# Patient Record
Sex: Female | Born: 1960 | Race: White | Hispanic: No | Marital: Married | Smoking: Former smoker
Health system: Southern US, Community
[De-identification: ages and names within clinical notes are randomized; demographics above are authoritative.]

## PROBLEM LIST (undated history)

## (undated) DIAGNOSIS — K219 Gastro-esophageal reflux disease without esophagitis: Secondary | ICD-10-CM

## (undated) DIAGNOSIS — E039 Hypothyroidism, unspecified: Secondary | ICD-10-CM

## (undated) DIAGNOSIS — I10 Essential (primary) hypertension: Secondary | ICD-10-CM

## (undated) DIAGNOSIS — J45909 Unspecified asthma, uncomplicated: Secondary | ICD-10-CM

## (undated) HISTORY — PX: TUBAL LIGATION: SHX77

## (undated) HISTORY — PX: BREAST CYST ASPIRATION: SHX578

---

## 2002-02-22 ENCOUNTER — Encounter: Payer: Self-pay | Admitting: Family Medicine

## 2002-02-22 ENCOUNTER — Encounter: Admission: RE | Admit: 2002-02-22 | Discharge: 2002-02-22 | Payer: Self-pay | Admitting: Family Medicine

## 2004-08-08 ENCOUNTER — Ambulatory Visit: Payer: Self-pay | Admitting: Family Medicine

## 2004-10-23 ENCOUNTER — Ambulatory Visit: Payer: Self-pay | Admitting: Family Medicine

## 2004-10-24 ENCOUNTER — Ambulatory Visit: Payer: Self-pay | Admitting: Family Medicine

## 2004-10-28 ENCOUNTER — Ambulatory Visit: Payer: Self-pay | Admitting: Family Medicine

## 2004-11-12 ENCOUNTER — Ambulatory Visit: Payer: Self-pay | Admitting: Family Medicine

## 2007-08-15 ENCOUNTER — Emergency Department (HOSPITAL_COMMUNITY): Admission: EM | Admit: 2007-08-15 | Discharge: 2007-08-15 | Payer: Self-pay | Admitting: Emergency Medicine

## 2012-02-10 ENCOUNTER — Other Ambulatory Visit: Payer: Self-pay | Admitting: Family Medicine

## 2012-02-10 DIAGNOSIS — Z1231 Encounter for screening mammogram for malignant neoplasm of breast: Secondary | ICD-10-CM

## 2012-02-17 ENCOUNTER — Ambulatory Visit
Admission: RE | Admit: 2012-02-17 | Discharge: 2012-02-17 | Disposition: A | Discharge status: Home / Self Care | Payer: 59 | Source: Ambulatory Visit | Attending: Family Medicine | Admitting: Family Medicine

## 2012-02-17 DIAGNOSIS — Z1231 Encounter for screening mammogram for malignant neoplasm of breast: Secondary | ICD-10-CM

## 2013-11-27 ENCOUNTER — Other Ambulatory Visit: Payer: Self-pay | Admitting: Obstetrics & Gynecology

## 2013-11-27 ENCOUNTER — Encounter (HOSPITAL_COMMUNITY): Payer: Self-pay | Admitting: Pharmacy Technician

## 2013-11-29 ENCOUNTER — Encounter (HOSPITAL_COMMUNITY): Payer: Self-pay

## 2013-11-29 ENCOUNTER — Encounter (HOSPITAL_COMMUNITY)
Admission: RE | Admit: 2013-11-29 | Discharge: 2013-11-29 | Disposition: A | Discharge status: Home / Self Care | Payer: BC Managed Care – PPO | Source: Ambulatory Visit | Attending: Obstetrics & Gynecology | Admitting: Obstetrics & Gynecology

## 2013-11-29 HISTORY — DX: Gastro-esophageal reflux disease without esophagitis: K21.9

## 2013-11-29 HISTORY — DX: Essential (primary) hypertension: I10

## 2013-11-29 HISTORY — DX: Unspecified asthma, uncomplicated: J45.909

## 2013-11-29 HISTORY — DX: Hypothyroidism, unspecified: E03.9

## 2013-11-29 LAB — COMPREHENSIVE METABOLIC PANEL
ALBUMIN: 3.7 g/dL (ref 3.5–5.2)
ALK PHOS: 81 U/L (ref 39–117)
ALT: 14 U/L (ref 0–35)
AST: 22 U/L (ref 0–37)
Anion gap: 10 (ref 5–15)
BILIRUBIN TOTAL: 0.4 mg/dL (ref 0.3–1.2)
BUN: 12 mg/dL (ref 6–23)
CHLORIDE: 101 meq/L (ref 96–112)
CO2: 27 mEq/L (ref 19–32)
Calcium: 10.3 mg/dL (ref 8.4–10.5)
Creatinine, Ser: 1.16 mg/dL — ABNORMAL HIGH (ref 0.50–1.10)
GFR calc Af Amer: 61 mL/min — ABNORMAL LOW (ref >90)
GFR calc non Af Amer: 53 mL/min — ABNORMAL LOW (ref >90)
Glucose, Bld: 91 mg/dL (ref 70–99)
POTASSIUM: 4.4 meq/L (ref 3.7–5.3)
Sodium: 138 mEq/L (ref 137–147)
Total Protein: 7.2 g/dL (ref 6.0–8.3)

## 2013-11-29 LAB — TYPE AND SCREEN
ABO/RH(D): O POS
Antibody Screen: NEGATIVE

## 2013-11-29 LAB — CBC
HCT: 38.1 % (ref 36.0–46.0)
Hemoglobin: 12.6 g/dL (ref 12.0–15.0)
MCH: 28.5 pg (ref 26.0–34.0)
MCHC: 33.1 g/dL (ref 30.0–36.0)
MCV: 86.2 fL (ref 78.0–100.0)
PLATELETS: 241 10*3/uL (ref 150–400)
RBC: 4.42 MIL/uL (ref 3.87–5.11)
RDW: 12.8 % (ref 11.5–15.5)
WBC: 5.1 10*3/uL (ref 4.0–10.5)

## 2013-11-29 LAB — ABO/RH: ABO/RH(D): O POS

## 2013-11-29 NOTE — Pre-Procedure Instructions (Signed)
EKG reviewed and accepted by Dr Arby BarretteHatchett.

## 2013-11-29 NOTE — Patient Instructions (Signed)
20 Lisa MarionDonna K Friedman  11/29/2013   Your procedure is scheduled on:  11/30/13  Enter through the Main Entrance of [facility] at 12:15 PM  Pick up the phone at the desk and dial [phone].   Call this number if you have problems the morning of surgery: [phone]   Remember:   Do not eat food:After Midnight.  Do not drink clear liquids: 6 Hours before arrival.  Take these medicines the morning of surgery with A SIP OF WATER: Blood pressure medication, Omeprazole, bring Inhaler.   Do not wear jewelry, make-up or nail polish.  Do not wear lotions, powders, or perfumes. You may wear deodorant.  Do not shave 48 hours prior to surgery.  Do not bring valuables to the hospital.  [facility] is not   responsible for any belongings or valuables brought to the hospital.  Contacts, dentures or bridgework may not be worn into surgery.  Leave suitcase in the car. After surgery it may be brought to your room.  For patients admitted to the hospital, checkout time is 11:00 AM the day of              discharge.   Patients discharged the day of surgery will not be allowed to drive             home.  Name and phone number of your driver: NA  Special Instructions:      Please read over the following fact sheets that you were given:   Surgical Site Infection Prevention

## 2013-11-30 ENCOUNTER — Ambulatory Visit (HOSPITAL_COMMUNITY)
Admission: RE | Admit: 2013-11-30 | Discharge: 2013-12-01 | Disposition: A | Discharge status: Home / Self Care | Payer: BC Managed Care – PPO | Source: Ambulatory Visit | Attending: Obstetrics & Gynecology | Admitting: Obstetrics & Gynecology

## 2013-11-30 ENCOUNTER — Encounter (HOSPITAL_COMMUNITY)
Admission: RE | Disposition: A | Discharge status: Home / Self Care | Payer: Self-pay | Source: Ambulatory Visit | Attending: Obstetrics & Gynecology

## 2013-11-30 ENCOUNTER — Encounter (HOSPITAL_COMMUNITY): Payer: BC Managed Care – PPO | Admitting: Anesthesiology

## 2013-11-30 ENCOUNTER — Encounter (HOSPITAL_COMMUNITY): Payer: Self-pay | Admitting: Certified Registered"

## 2013-11-30 ENCOUNTER — Ambulatory Visit (HOSPITAL_COMMUNITY): Payer: BC Managed Care – PPO | Admitting: Anesthesiology

## 2013-11-30 DIAGNOSIS — Z87891 Personal history of nicotine dependence: Secondary | ICD-10-CM | POA: Insufficient documentation

## 2013-11-30 DIAGNOSIS — N812 Incomplete uterovaginal prolapse: Secondary | ICD-10-CM | POA: Insufficient documentation

## 2013-11-30 DIAGNOSIS — Z9889 Other specified postprocedural states: Secondary | ICD-10-CM

## 2013-11-30 DIAGNOSIS — J45909 Unspecified asthma, uncomplicated: Secondary | ICD-10-CM | POA: Insufficient documentation

## 2013-11-30 DIAGNOSIS — K219 Gastro-esophageal reflux disease without esophagitis: Secondary | ICD-10-CM | POA: Insufficient documentation

## 2013-11-30 DIAGNOSIS — I10 Essential (primary) hypertension: Secondary | ICD-10-CM | POA: Insufficient documentation

## 2013-11-30 DIAGNOSIS — E039 Hypothyroidism, unspecified: Secondary | ICD-10-CM | POA: Insufficient documentation

## 2013-11-30 HISTORY — PX: ROBOTIC ASSISTED TOTAL HYSTERECTOMY: SHX6085

## 2013-11-30 LAB — PREGNANCY, URINE: PREG TEST UR: NEGATIVE

## 2013-11-30 SURGERY — ROBOTIC ASSISTED TOTAL HYSTERECTOMY
Anesthesia: General | Site: Uterus | Laterality: Bilateral

## 2013-11-30 MED ORDER — SCOPOLAMINE 1 MG/3DAYS TD PT72
MEDICATED_PATCH | TRANSDERMAL | Status: AC
  Filled 2013-11-30: qty 1

## 2013-11-30 MED ORDER — LACTATED RINGERS IV SOLN
INTRAVENOUS | Status: DC
  Administered 2013-11-30: 20:00:00 via INTRAVENOUS

## 2013-11-30 MED ORDER — HYDROMORPHONE HCL PF 1 MG/ML IJ SOLN
0.2500 mg | INTRAMUSCULAR | Status: DC | PRN
  Administered 2013-11-30 (×2): 0.5 mg via INTRAVENOUS

## 2013-11-30 MED ORDER — VASOPRESSIN 20 UNIT/ML IJ SOLN
INTRAMUSCULAR | Status: AC
  Filled 2013-11-30: qty 1

## 2013-11-30 MED ORDER — NEOSTIGMINE METHYLSULFATE 10 MG/10ML IV SOLN
INTRAVENOUS | Status: DC | PRN
  Administered 2013-11-30: 4 mg via INTRAVENOUS

## 2013-11-30 MED ORDER — GLYCOPYRROLATE 0.2 MG/ML IJ SOLN
INTRAMUSCULAR | Status: DC | PRN
  Administered 2013-11-30: .7 mg via INTRAVENOUS

## 2013-11-30 MED ORDER — LIDOCAINE HCL (CARDIAC) 20 MG/ML IV SOLN
INTRAVENOUS | Status: AC
  Filled 2013-11-30: qty 5

## 2013-11-30 MED ORDER — ALBUTEROL SULFATE (2.5 MG/3ML) 0.083% IN NEBU: 2.5000 mg | INHALATION_SOLUTION | Freq: Four times a day (QID) | RESPIRATORY_TRACT | Status: DC | PRN

## 2013-11-30 MED ORDER — BUPIVACAINE HCL (PF) 0.25 % IJ SOLN
INTRAMUSCULAR | Status: AC
  Filled 2013-11-30: qty 30

## 2013-11-30 MED ORDER — NEOSTIGMINE METHYLSULFATE 10 MG/10ML IV SOLN
INTRAVENOUS | Status: AC
  Filled 2013-11-30: qty 1

## 2013-11-30 MED ORDER — FENTANYL CITRATE 0.05 MG/ML IJ SOLN
INTRAMUSCULAR | Status: AC
  Filled 2013-11-30: qty 2

## 2013-11-30 MED ORDER — MIDAZOLAM HCL 2 MG/2ML IJ SOLN
INTRAMUSCULAR | Status: DC | PRN
  Administered 2013-11-30: 2 mg via INTRAVENOUS

## 2013-11-30 MED ORDER — PROPOFOL 10 MG/ML IV BOLUS
INTRAVENOUS | Status: DC | PRN
  Administered 2013-11-30: 200 mg via INTRAVENOUS

## 2013-11-30 MED ORDER — OXYCODONE-ACETAMINOPHEN 5-325 MG PO TABS
1.0000 | ORAL_TABLET | ORAL | Status: DC | PRN
  Administered 2013-12-01: 2 via ORAL
  Filled 2013-11-30: qty 2

## 2013-11-30 MED ORDER — LIDOCAINE HCL (CARDIAC) 20 MG/ML IV SOLN
INTRAVENOUS | Status: DC | PRN
  Administered 2013-11-30: 80 mg via INTRAVENOUS

## 2013-11-30 MED ORDER — HYDROMORPHONE HCL PF 1 MG/ML IJ SOLN: 1.0000 mg | INTRAMUSCULAR | Status: DC | PRN

## 2013-11-30 MED ORDER — FENTANYL CITRATE 0.05 MG/ML IJ SOLN
INTRAMUSCULAR | Status: AC
  Filled 2013-11-30: qty 5

## 2013-11-30 MED ORDER — SODIUM CHLORIDE 0.9 % IJ SOLN
INTRAMUSCULAR | Status: AC
  Filled 2013-11-30: qty 20

## 2013-11-30 MED ORDER — MIDAZOLAM HCL 2 MG/2ML IJ SOLN
INTRAMUSCULAR | Status: AC
  Filled 2013-11-30: qty 2

## 2013-11-30 MED ORDER — ROCURONIUM BROMIDE 100 MG/10ML IV SOLN
INTRAVENOUS | Status: DC | PRN
  Administered 2013-11-30: 5 mg via INTRAVENOUS
  Administered 2013-11-30: 10 mg via INTRAVENOUS
  Administered 2013-11-30: 50 mg via INTRAVENOUS

## 2013-11-30 MED ORDER — ONDANSETRON HCL 4 MG/2ML IJ SOLN
INTRAMUSCULAR | Status: AC
  Filled 2013-11-30: qty 2

## 2013-11-30 MED ORDER — DEXAMETHASONE SODIUM PHOSPHATE 10 MG/ML IJ SOLN
INTRAMUSCULAR | Status: AC
  Filled 2013-11-30: qty 1

## 2013-11-30 MED ORDER — PROMETHAZINE HCL 25 MG/ML IJ SOLN: 6.2500 mg | INTRAMUSCULAR | Status: DC | PRN

## 2013-11-30 MED ORDER — CEFAZOLIN SODIUM-DEXTROSE 2-3 GM-% IV SOLR: 2.0000 g | INTRAVENOUS | Status: DC

## 2013-11-30 MED ORDER — PROPOFOL 10 MG/ML IV EMUL
INTRAVENOUS | Status: AC
  Filled 2013-11-30: qty 20

## 2013-11-30 MED ORDER — HYDROMORPHONE HCL PF 1 MG/ML IJ SOLN
INTRAMUSCULAR | Status: AC
  Administered 2013-11-30: 0.5 mg via INTRAVENOUS
  Filled 2013-11-30: qty 1

## 2013-11-30 MED ORDER — HYDROMORPHONE HCL PF 1 MG/ML IJ SOLN
INTRAMUSCULAR | Status: DC | PRN
  Administered 2013-11-30: 1 mg via INTRAVENOUS

## 2013-11-30 MED ORDER — LISINOPRIL-HYDROCHLOROTHIAZIDE 20-12.5 MG PO TABS
0.5000 | ORAL_TABLET | Freq: Every day | ORAL | Status: DC
  Filled 2013-11-30: qty 1

## 2013-11-30 MED ORDER — LACTATED RINGERS IR SOLN
Status: DC | PRN
  Administered 2013-11-30: 3000 mL

## 2013-11-30 MED ORDER — FENTANYL CITRATE 0.05 MG/ML IJ SOLN
INTRAMUSCULAR | Status: DC | PRN
  Administered 2013-11-30 (×5): 50 ug via INTRAVENOUS

## 2013-11-30 MED ORDER — GLYCOPYRROLATE 0.2 MG/ML IJ SOLN
INTRAMUSCULAR | Status: AC
  Filled 2013-11-30: qty 3

## 2013-11-30 MED ORDER — LEVOTHYROXINE SODIUM 50 MCG PO TABS
50.0000 ug | ORAL_TABLET | Freq: Every day | ORAL | Status: DC
  Administered 2013-12-01: 50 ug via ORAL
  Filled 2013-11-30: qty 1

## 2013-11-30 MED ORDER — KETOROLAC TROMETHAMINE 30 MG/ML IJ SOLN: 15.0000 mg | Freq: Once | INTRAMUSCULAR | Status: DC | PRN

## 2013-11-30 MED ORDER — ROCURONIUM BROMIDE 100 MG/10ML IV SOLN
INTRAVENOUS | Status: AC
  Filled 2013-11-30: qty 1

## 2013-11-30 MED ORDER — IBUPROFEN 600 MG PO TABS
600.0000 mg | ORAL_TABLET | Freq: Four times a day (QID) | ORAL | Status: DC | PRN
  Administered 2013-12-01: 600 mg via ORAL
  Filled 2013-11-30: qty 1

## 2013-11-30 MED ORDER — KETOROLAC TROMETHAMINE 30 MG/ML IJ SOLN
INTRAMUSCULAR | Status: DC | PRN
  Administered 2013-11-30: 30 mg via INTRAVENOUS

## 2013-11-30 MED ORDER — HYDROMORPHONE HCL PF 1 MG/ML IJ SOLN
INTRAMUSCULAR | Status: AC
  Filled 2013-11-30: qty 1

## 2013-11-30 MED ORDER — MEPERIDINE HCL 25 MG/ML IJ SOLN: 6.2500 mg | INTRAMUSCULAR | Status: DC | PRN

## 2013-11-30 MED ORDER — LISINOPRIL-HYDROCHLOROTHIAZIDE 20-12.5 MG PO TABS: 0.5000 | ORAL_TABLET | Freq: Every day | ORAL | Status: DC

## 2013-11-30 MED ORDER — CEFAZOLIN SODIUM-DEXTROSE 2-3 GM-% IV SOLR
INTRAVENOUS | Status: AC
  Administered 2013-11-30: 2 g via INTRAVENOUS
  Filled 2013-11-30: qty 50

## 2013-11-30 MED ORDER — DEXAMETHASONE SODIUM PHOSPHATE 10 MG/ML IJ SOLN
INTRAMUSCULAR | Status: DC | PRN
  Administered 2013-11-30: 10 mg via INTRAVENOUS

## 2013-11-30 MED ORDER — SCOPOLAMINE 1 MG/3DAYS TD PT72
1.0000 | MEDICATED_PATCH | TRANSDERMAL | Status: DC
  Administered 2013-11-30: 1.5 mg via TRANSDERMAL

## 2013-11-30 MED ORDER — MIDAZOLAM HCL 2 MG/2ML IJ SOLN: 0.5000 mg | Freq: Once | INTRAMUSCULAR | Status: DC | PRN

## 2013-11-30 MED ORDER — ONDANSETRON HCL 4 MG/2ML IJ SOLN
INTRAMUSCULAR | Status: DC | PRN
Start: 2013-11-30 — End: 2013-11-30
  Administered 2013-11-30: 4 mg via INTRAVENOUS

## 2013-11-30 MED ORDER — KETOROLAC TROMETHAMINE 30 MG/ML IJ SOLN
INTRAMUSCULAR | Status: AC
  Filled 2013-11-30: qty 1

## 2013-11-30 MED ORDER — BUPIVACAINE HCL (PF) 0.25 % IJ SOLN
INTRAMUSCULAR | Status: DC | PRN
  Administered 2013-11-30: 15 mL

## 2013-11-30 MED ORDER — LACTATED RINGERS IV SOLN
INTRAVENOUS | Status: DC
  Administered 2013-11-30 (×2): via INTRAVENOUS

## 2013-11-30 SURGICAL SUPPLY — 59 items
ADH SKN CLS APL DERMABOND .7 (GAUZE/BANDAGES/DRESSINGS) ×2
BARRIER ADHS 3X4 INTERCEED (GAUZE/BANDAGES/DRESSINGS) ×4 IMPLANT
BRR ADH 4X3 ABS CNTRL BYND (GAUZE/BANDAGES/DRESSINGS) ×2
CATH FOLEY 3WAY  5CC 16FR (CATHETERS) ×2
CATH FOLEY 3WAY 5CC 16FR (CATHETERS) ×2 IMPLANT
CLOTH BEACON ORANGE TIMEOUT ST (SAFETY) ×4 IMPLANT
CONT PATH 16OZ SNAP LID 3702 (MISCELLANEOUS) ×4 IMPLANT
COVER MAYO STAND STRL (DRAPES) ×4 IMPLANT
COVER TABLE BACK 60X90 (DRAPES) ×8 IMPLANT
COVER TIP SHEARS 8 DVNC (MISCELLANEOUS) ×2 IMPLANT
COVER TIP SHEARS 8MM DA VINCI (MISCELLANEOUS) ×2
DECANTER SPIKE VIAL GLASS SM (MISCELLANEOUS) ×4 IMPLANT
DERMABOND ADVANCED (GAUZE/BANDAGES/DRESSINGS) ×2
DERMABOND ADVANCED .7 DNX12 (GAUZE/BANDAGES/DRESSINGS) ×3 IMPLANT
DRAPE HUG U DISPOSABLE (DRAPE) ×4 IMPLANT
DRAPE WARM FLUID 44X44 (DRAPE) ×4 IMPLANT
DRSG COVADERM PLUS 2X2 (GAUZE/BANDAGES/DRESSINGS) ×12 IMPLANT
DRSG OPSITE POSTOP 3X4 (GAUZE/BANDAGES/DRESSINGS) ×3 IMPLANT
DURAPREP 26ML APPLICATOR (WOUND CARE) ×4 IMPLANT
ELECT REM PT RETURN 9FT ADLT (ELECTROSURGICAL) ×4
ELECTRODE REM PT RTRN 9FT ADLT (ELECTROSURGICAL) ×2 IMPLANT
EVACUATOR SMOKE 8.L (FILTER) ×4 IMPLANT
GAUZE VASELINE 3X9 (GAUZE/BANDAGES/DRESSINGS) IMPLANT
GLOVE BIO SURGEON STRL SZ 6.5 (GLOVE) ×3 IMPLANT
GLOVE BIO SURGEON STRL SZ7 (GLOVE) ×4 IMPLANT
GLOVE BIO SURGEONS STRL SZ 6.5 (GLOVE) ×1
GLOVE BIOGEL PI IND STRL 7.0 (GLOVE) ×14 IMPLANT
GLOVE BIOGEL PI INDICATOR 7.0 (GLOVE) ×22
GLOVE SURG SS PI 7.0 STRL IVOR (GLOVE) ×30 IMPLANT
GOWN STRL REUS W/TWL LRG LVL3 (GOWN DISPOSABLE) ×40 IMPLANT
KIT ACCESSORY DA VINCI DISP (KITS) ×2
KIT ACCESSORY DVNC DISP (KITS) ×2 IMPLANT
LEGGING LITHOTOMY PAIR STRL (DRAPES) ×4 IMPLANT
OCCLUDER COLPOPNEUMO (BALLOONS) ×3 IMPLANT
PACK ROBOT WH (CUSTOM PROCEDURE TRAY) ×4 IMPLANT
PAD PREP 24X48 CUFFED NSTRL (MISCELLANEOUS) ×8 IMPLANT
PROTECTOR NERVE ULNAR (MISCELLANEOUS) ×8 IMPLANT
SET CYSTO W/LG BORE CLAMP LF (SET/KITS/TRAYS/PACK) ×3 IMPLANT
SET IRRIG TUBING LAPAROSCOPIC (IRRIGATION / IRRIGATOR) ×8 IMPLANT
SUT ETHIBOND 0 (SUTURE) ×3 IMPLANT
SUT ETHIBOND NAB CT1 #1 30IN (SUTURE) ×6 IMPLANT
SUT VIC AB 0 CT1 27 (SUTURE) ×8
SUT VIC AB 0 CT1 27XBRD ANBCTR (SUTURE) ×4 IMPLANT
SUT VIC AB 4-0 PS2 27 (SUTURE) ×8 IMPLANT
SUT VICRYL 0 UR6 27IN ABS (SUTURE) ×8 IMPLANT
SUT VLOC 180 0 6IN GS21 (SUTURE) ×3 IMPLANT
SUT VLOC 180 0 9IN  GS21 (SUTURE) ×8
SUT VLOC 180 0 9IN GS21 (SUTURE) ×4 IMPLANT
SUT VLOC 180 2-0 9IN GS21 (SUTURE) ×3 IMPLANT
SYRINGE 60CC LL (MISCELLANEOUS) ×4 IMPLANT
TIP UTERINE 6.7X6CM WHT DISP (MISCELLANEOUS) ×3 IMPLANT
TOWEL OR 17X24 6PK STRL BLUE (TOWEL DISPOSABLE) ×12 IMPLANT
TROCAR 12M 150ML BLUNT (TROCAR) ×3 IMPLANT
TROCAR DISP BLADELESS 8 DVNC (TROCAR) ×2 IMPLANT
TROCAR DISP BLADELESS 8MM (TROCAR) ×2
TROCAR XCEL 12X100 BLDLESS (ENDOMECHANICALS) ×4 IMPLANT
TROCAR XCEL NON-BLD 5MMX100MML (ENDOMECHANICALS) ×4 IMPLANT
WARMER LAPAROSCOPE (MISCELLANEOUS) ×4 IMPLANT
WATER STERILE IRR 1000ML POUR (IV SOLUTION) ×12 IMPLANT

## 2013-11-30 NOTE — Anesthesia Preprocedure Evaluation (Signed)
Anesthesia Evaluation  Patient identified by MRN, date of birth, ID band Patient awake    Reviewed: Allergy & Precautions, H&P , Patient's Chart, lab work & pertinent test results, reviewed documented beta blocker date and time   History of Anesthesia Complications Negative for: history of anesthetic complications  Airway Mallampati: II TM Distance: >3 FB Neck ROM: full    Dental   Pulmonary asthma , former smoker,  breath sounds clear to auscultation        Cardiovascular Exercise Tolerance: Good hypertension, Rhythm:regular Rate:Normal     Neuro/Psych    GI/Hepatic GERD-  Controlled,  Endo/Other  Hypothyroidism   Renal/GU      Musculoskeletal   Abdominal   Peds  Hematology   Anesthesia Other Findings   Reproductive/Obstetrics                           Anesthesia Physical Anesthesia Plan  ASA: II  Anesthesia Plan: General ETT   Post-op Pain Management:    Induction:   Airway Management Planned:   Additional Equipment:   Intra-op Plan:   Post-operative Plan:   Informed Consent: I have reviewed the patients History and Physical, chart, labs and discussed the procedure including the risks, benefits and alternatives for the proposed anesthesia with the patient or authorized representative who has indicated his/her understanding and acceptance.   Dental Advisory Given  Plan Discussed with: CRNA and Surgeon  Anesthesia Plan Comments:         Anesthesia Quick Evaluation

## 2013-11-30 NOTE — H&P (Signed)
Alain MarionDonna K Boliver is an 53 y.o. female G2P2L3 (1 adopted)  Married  RP:  Uterine prolapse/CystoRectocele for Robotic TLH/Salpingectomies/Utero-Sacral ligament suspension and Anterio-Posterior repair  Pertinent Gynecological History: Menses: post-menopausal Contraception: post menopausal status /BT/S Blood transfusions: none Sexually transmitted diseases: no past history Last mammogram: normal  Last pap: normal  OB History: G2P2L3 (1 adopted)  Menstrual History:  No LMP recorded.    Past Medical History  Diagnosis Date  . Hypertension   . Asthma   . GERD (gastroesophageal reflux disease)   . Hypothyroidism     Past Surgical History  Procedure Laterality Date  . Tubal ligation      No family history on file.  Social History:  reports that she has quit smoking. She does not have any smokeless tobacco history on file. She reports that she does not drink alcohol or use illicit drugs.  Allergies: No Known Allergies  Prescriptions prior to admission  Medication Sig Dispense Refill  . aspirin 81 MG tablet Take 81 mg by mouth daily.      Marland Kitchen. levothyroxine (SYNTHROID, LEVOTHROID) 50 MCG tablet Take 50 mcg by mouth daily before breakfast.      . lisinopril-hydrochlorothiazide (PRINZIDE,ZESTORETIC) 20-12.5 MG per tablet Take 0.5 tablets by mouth daily.      . Omeprazole (PRILOSEC PO) Take 1 tablet by mouth daily.      Marland Kitchen. albuterol (PROVENTIL HFA;VENTOLIN HFA) 108 (90 BASE) MCG/ACT inhaler Inhale 2 puffs into the lungs every 6 (six) hours as needed for wheezing or shortness of breath.      Marland Kitchen. ibuprofen (ADVIL,MOTRIN) 200 MG tablet Take 400 mg by mouth every 6 (six) hours as needed for headache.        ROS  Blood pressure 122/79, pulse 74, temperature 98.1 F (36.7 C), temperature source Oral, resp. rate 18, SpO2 100.00%. Physical Exam  Uterine prolapse grade 2/3, Cystocele grade 3/4, Rectocele grade 1/3.  Results for orders placed during the hospital encounter of 11/30/13 (from the  past 24 hour(s))  PREGNANCY, URINE     Status: None   Collection Time    11/30/13 12:15 PM      Result Value Ref Range   Preg Test, Ur NEGATIVE  NEGATIVE    No results found.  Assessment/Plan: Uterine Prolapse/Cysto-Rectocele for Robotic TLH/Salpingectomies/Utero-Sacral ligament suspension and AP repair.  Surgery and risks reviewed.  Kealie Barrie,MARIE-LYNE 11/30/2013, 1:39 PM

## 2013-11-30 NOTE — Transfer of Care (Signed)
Immediate Anesthesia Transfer of Care Note  Patient: Lisa MarionDonna K Redler  Procedure(s) Performed: Procedure(s): ROBOTIC ASSISTED TOTAL HYSTERECTOMY with bilateral salpingectomy and epicentric uterosacral ligament fixation for sacrocolpopexy and cystoscopy (Bilateral)  Patient Location: PACU  Anesthesia Type:General  Level of Consciousness: awake, alert  and oriented  Airway & Oxygen Therapy: Patient Spontanous Breathing and Patient connected to nasal cannula oxygen  Post-op Assessment: Report given to PACU RN, Post -op Vital signs reviewed and stable and Patient moving all extremities  Post vital signs: Reviewed and stable  Complications: No apparent anesthesia complications

## 2013-11-30 NOTE — Anesthesia Postprocedure Evaluation (Signed)
Anesthesia Post Note  Patient: Alain MarionDonna K Petros  Procedure(s) Performed: Procedure(s) (LRB): ROBOTIC ASSISTED TOTAL HYSTERECTOMY with bilateral salpingectomy and epicentric uterosacral ligament fixation for sacrocolpopexy and cystoscopy (Bilateral)  Anesthesia type: General  Patient location: PACU  Post pain: Pain level controlled  Post assessment: Post-op Vital signs reviewed  Last Vitals:  Filed Vitals:   11/30/13 1815  BP: 121/75  Pulse: 75  Temp: 36.7 C  Resp: 12    Post vital signs: Reviewed  Level of consciousness: sedated  Complications: No apparent anesthesia complications

## 2013-11-30 NOTE — Op Note (Signed)
11/30/2013  5:05 PM  PATIENT:  Lisa Friedman  53 y.o. female  PRE-OPERATIVE DIAGNOSIS:  Symptomatic Uterine Prolapse, CystoRectocele  POST-OPERATIVE DIAGNOSIS:  Symptomatic Uterine Prolapse, CystoRectocele  PROCEDURE:  Procedure(s): ROBOTIC ASSISTED TOTAL HYSTERECTOMY with bilateral salpingectomy and epicentric uterosacral ligament fixation for sacrocolpopexy and cystoscopy  SURGEON:  Surgeon(s): Genia DelMarie-Lyne Bonnee Zertuche, MD Alphonsus SiasKelly A. Ernestina PennaFogleman, MD  ASSISTANTS: Alphonsus SiasKelly A. Fogleman MD   ANESTHESIA:   general  PROCEDURE:  Under general anesthesia with endotracheal intubation the patient is in lithotomy position. She is prepped with ChloraPrep on the abdomen and with Betadine on the suprapubic, vulvar and vaginal areas. She is draped as usual. The Foley is inserted in the bladder. The vaginal exam reveals a normal-size uterus with a grade 2/3 prolapse, no adnexal mass. A cystocele grade 3/4 and a rectocele grade are present.  A #6 roomy with the medium Ko-ring are put in place easily.  Abdominally, a supraumbilical incision is made with a scalpel over 1.5 cm after infiltrating Marcaine one quarter plain. The aponeurosis is opened with Mayo scissors under direct vision. The peritoneum is opened bluntly with a finger. A pursestring stitch of Vicryl 0 is done on the aponeurosis. The Roseanne RenoHassan is inserted at that level and a pneumoperitoneum is created with CO2.  We used a semicircular configuration for port placement.  2 robotic ports on the right and one robotic ports on the lower left with the assistant port other 5 mm trochar on the upper left.  All sites were infiltrated with Marcaine one quarter plain and a small incision done with the scalpel. All ports were inserted under direct vision. The robot his docked on the right side.  The robotic instruments are inserted under direct vision with the Endo Shears scissors and the first arm, the PK in the second arm and the fenestrated clamp in the third arm.  We go to  the console. The pictures are taken of the uterus both tubes and ovaries, the appendix and the liver.  The patient is status post bilateral tubal sterilization.  Both ureters are normal anatomic position. We starts on the left side we cauterized and section the left round ligament. We cauterized and section the left distal part of the tube.  We then cauterized and section the left utero-ovarian ligament. We opened the visceral peritoneum anteriorly and start descending the bladder. We proceed exactly the same way on the right side.  Both tubes are removed through the robotic port by the assistant.  We then further descended the bladder passed the core ring.  The right uterine artery was cauterized and the left uterine artery was cauterized  and sectioned.  We then went back on the right side and section the right uterine artery.  The upper aspect of the vaginal vault was opened over the coring with the tip of the Endo Shears scissor starting posteriorly, then anteriorly and finishing on both sides to both sides.  The uterus with cervix was completely detached and passed vaginally. It was sent to pathology with both tubes.  The occluder was put in the vagina.  We switched instruments to the cutting needle driver and the first arm, the lungs tip and the second arm and the PK in the third arm. We used a V. Lock 0 in a running suture to close the vaginal vault, starting at the right angle and finishing at the left angle and then coming back on our steps.  We then used a V. Lock 0 for the epicentric  bilateral uterosacral ligament fixation for sacrocolpopexy.  We starts on the right side, taking a bite on the right uterosacral ligament distally and suturing it to the proximal aspect of the ligament close to the vaginal vault.  We then took him bites in the vaginal vault and came back to the uterosacral ligament.  We proceeded exactly the same way on the left side.  Both ureters were at the very good distance from the  uterosacral ligaments and well-visualized.  We reinforced the attachment of the uterosacral ligament to the vaginal vault on each side with a permanent stitch of Ethibonb 0.  We then covered those stitches by closing the peritoneum with a running suture of V. LOC 2-0 above the vaginal vault.  Hemostasis was adequate at all levels.  The pelvic cavity was irrigated and suctioned. The needles were parked on the left abdominal wall.  We removed all robotic instruments. The robot is undocked.  We then went by laparoscopy. All needles were removed. Hemostasis was confirmed once more. The patient was removed from Trendelenburg and a small amount of fluid was suctioned.  We then are removed laparoscopy instruments. We removed the ports under direct vision.  The CO2 was evacuated.  The pursestring stitch at the supraumbilical incision was closed. All incisions were closed with a subcuticular stitch of Vicryl 4-0. Dermabond was added on all incisions.  We then went to the the vagina with the patient in lithotomy position.  The occluder was removed.  The Foley was also removed. We proceeded with a cystoscopy which revealed good ureteral jets bilaterally.  Even with the bladder full, no cystocele was present after the apical suspension.  The rectocele was also completely corrected by the suspension of the vaginal vault.  The decision was therefore taken not to proceed with any anterior or posterior repair.  The patient was brought to recovery room in good and stable status.  ESTIMATED BLOOD LOSS:  50 cc   Intake/Output Summary (Last 24 hours) at 11/30/13 1705 Last data filed at 11/30/13 1655  Gross per 24 hour  Intake   1000 ml  Output    450 ml  Net    550 ml     BLOOD ADMINISTERED:none   LOCAL MEDICATIONS USED:  MARCAINE     SPECIMEN:  Source of Specimen:  Uterus with cervix and bilateral tubes  DISPOSITION OF SPECIMEN:  PATHOLOGY  COUNTS:  YES  PLAN OF CARE: Transfer to PACU  Genia DelMarie-Lyne Helyne Genther MD   11/30/2013 at 5:05 pm

## 2013-12-01 ENCOUNTER — Encounter (HOSPITAL_COMMUNITY): Payer: Self-pay | Admitting: Obstetrics & Gynecology

## 2013-12-01 LAB — CBC
HEMATOCRIT: 36 % (ref 36.0–46.0)
HEMOGLOBIN: 12.2 g/dL (ref 12.0–15.0)
MCH: 29 pg (ref 26.0–34.0)
MCHC: 33.9 g/dL (ref 30.0–36.0)
MCV: 85.5 fL (ref 78.0–100.0)
Platelets: 240 10*3/uL (ref 150–400)
RBC: 4.21 MIL/uL (ref 3.87–5.11)
RDW: 12.7 % (ref 11.5–15.5)
WBC: 10.7 10*3/uL — AB (ref 4.0–10.5)

## 2013-12-01 MED ORDER — OXYCODONE-ACETAMINOPHEN 7.5-325 MG PO TABS: 1.0000 | ORAL_TABLET | ORAL | Status: DC | PRN

## 2013-12-01 NOTE — Anesthesia Postprocedure Evaluation (Signed)
  Anesthesia Post-op Note  Anesthesia Post Note  Patient: Lisa Friedman  Procedure(s) Performed: Procedure(s) (LRB): ROBOTIC ASSISTED TOTAL HYSTERECTOMY with bilateral salpingectomy and epicentric uterosacral ligament fixation for sacrocolpopexy and cystoscopy (Bilateral)  Anesthesia type: General  Patient location: [facility]  Post pain: Pain level controlled  Post assessment: Post-op Vital signs reviewed  Last Vitals:  Filed Vitals:   12/01/13 0512  BP: 88/50  Pulse: 84  Temp: 36.8 C  Resp: 16    Post vital signs: Reviewed  Level of consciousness: sedated  Complications: No apparent anesthesia complications

## 2013-12-01 NOTE — Progress Notes (Signed)
Pt discharged to home with husband.  Condition stable.  Pt to car via wheelchair with RN.  No equipment for home ordered at discharge. 

## 2013-12-01 NOTE — Progress Notes (Signed)
1 Day Post-Op Procedure(s) (LRB): ROBOTIC ASSISTED TOTAL HYSTERECTOMY with bilateral salpingectomy and epicentric uterosacral ligament fixation for sacrocolpopexy and cystoscopy (Bilateral)  Subjective: Patient reports that pain is well managed.  Tolerating normal diet as tolerated  diet without difficulty. No nausea / vomiting.  Ambulating and voiding.  Objective: BP 88/50  Pulse 84  Temp(Src) 98.2 F (36.8 C) (Oral)  Resp 16  Ht 5\' 3"  (1.6 m)  Wt 80.74 kg (178 lb)  BMI 31.54 kg/m2  SpO2 95% Lungs: clear Heart: normal rate and rhythm Abdomen:soft and appropriately tender Extremities: Homans sign is negative, no sign of DVT Incision: healing well  Postop Hb 12.2, WBC 10.7  Assessment: s/p Procedure(s): ROBOTIC ASSISTED TOTAL HYSTERECTOMY with bilateral salpingectomy and epicentric uterosacral ligament fixation for sacrocolpopexy and cystoscopy: stable and progressing well  Plan: Encourage ambulation Discharge home  LOS: 1 day    Katy Brickell,MARIE-LYNE 12/01/2013, 7:05 AM

## 2013-12-01 NOTE — Addendum Note (Signed)
Addendum created 12/01/13 [id number] by Turner DanielsJennifer L Sylvana Bonk, CRNA   Modules edited: Notes Section   Notes Section:  File: [id number]

## 2013-12-01 NOTE — Discharge Instructions (Signed)
Total Laparoscopic Hysterectomy, Care After °Refer to this sheet in the next few weeks. These instructions provide you with information on caring for yourself after your procedure. Your health care provider may also give you more specific instructions. Your treatment has been planned according to current medical practices, but problems sometimes occur. Call your health care provider if you have any problems or questions after your procedure. °WHAT TO EXPECT AFTER THE PROCEDURE °· Pain and bruising at the incision sites. You will be given pain medicine to control it. °· Menopausal symptoms such as hot flashes, night sweats, and insomnia if your ovaries were removed. °· Sore throat from the breathing tube that was inserted during surgery. °HOME CARE INSTRUCTIONS °· Only take over-the-counter or prescription medicines for pain, discomfort, or fever as directed by your health care provider.   °· Do not take aspirin. It can cause bleeding.   °· Do not drive when taking pain medicine.   °· Follow your health care provider's advice regarding diet, exercise, lifting, driving, and general activities.   °· Resume your usual diet as directed and allowed.   °· Get plenty of rest and sleep.   °· Do not douche, use tampons, or have sexual intercourse for at least 6 weeks, or until your health care provider gives you permission.   °· Change your bandages (dressings) as directed by your health care provider.   °· Monitor your temperature and notify your health care provider of a fever.   °· Take showers instead of baths for 2-3 weeks.   °· Do not drink alcohol until your health care provider gives you permission.   °· If you develop constipation, you may take a mild laxative with your health care provider's permission. Bran foods may help with constipation problems. Drinking enough fluids to keep your urine clear or pale yellow may help as well.   °· Try to have someone home with you for 1-2 weeks to help around the house.    °· Keep all of your follow-up appointments as directed by your health care provider.   °SEEK MEDICAL CARE IF: °· You have swelling, redness, or increasing pain around your incision sites.   °· You have pus coming from your incision.   °· You notice a bad smell coming from your incision.   °· Your incision breaks open.   °· You feel dizzy or lightheaded.   °· You have pain or bleeding when you urinate.   °· You have persistent diarrhea.   °· You have persistent nausea and vomiting.   °· You have abnormal vaginal discharge.   °· You have a rash.   °· You have any type of abnormal reaction or develop an allergy to your medicine.   °· You have poor pain control with your prescribed medicine.   °SEEK IMMEDIATE MEDICAL CARE IF: °· You have chest pain or shortness of breath. °· You have severe abdominal pain that is not relieved with pain medicine. °· You have pain or swelling in your legs. °MAKE SURE YOU: °· Understand these instructions. °· Will watch your condition. °· Will get help right away if you are not doing well or get worse. °Document Released: 03/08/2013 Document Revised: 05/23/2013 Document Reviewed: 03/08/2013 °[employer]® Patient Information ©2015 [employer]. This information is not intended to replace advice given to you by your health care provider. Make sure you discuss any questions you have with your health care provider. ° °

## 2013-12-01 NOTE — Discharge Summary (Signed)
  Physician Discharge Summary  Patient ID: Alain MarionDonna K Cardenas MRN: [id number] DOB/AGE: Oct 06, 1960 53 y.o.  Admit date: 11/30/2013 Discharge date: 12/01/2013  Admission Diagnoses: Symptomatic Uterine Prolapse, CystoRectocele  Discharge Diagnoses: Symptomatic Uterine Prolapse, CystoRectocele        Active Problems:   Postoperative state   Discharged Condition: good  Hospital Course:  good  Consults: None  Treatments: surgery: Robotic total laparoscopic hysterectomy with bilateral salpingectomy and epicentric uterosacral ligament fixation for Sacrocolpopexy  Disposition: Final discharge disposition not confirmed     Medication List         albuterol 108 (90 BASE) MCG/ACT inhaler  Commonly known as:  PROVENTIL HFA;VENTOLIN HFA  Inhale 2 puffs into the lungs every 6 (six) hours as needed for wheezing or shortness of breath.     aspirin 81 MG tablet  Take 81 mg by mouth daily.     ibuprofen 200 MG tablet  Commonly known as:  ADVIL,MOTRIN  Take 400 mg by mouth every 6 (six) hours as needed for headache.     levothyroxine 50 MCG tablet  Commonly known as:  SYNTHROID, LEVOTHROID  Take 50 mcg by mouth daily before breakfast.     lisinopril-hydrochlorothiazide 20-12.5 MG per tablet  Commonly known as:  PRINZIDE,ZESTORETIC  Take 0.5 tablets by mouth daily.     oxyCODONE-acetaminophen 7.5-325 MG per tablet  Commonly known as:  PERCOCET  Take 1 tablet by mouth every 4 (four) hours as needed for pain.     PRILOSEC PO  Take 1 tablet by mouth daily.           Follow-up Information   Follow up with Tanyah Debruyne,MARIE-LYNE, MD In 3 weeks.   Specialty:  Obstetrics and Gynecology   Contact information:   [address] [place] [id number] [phone]       Signed: Genia DelLAVOIE,MARIE-LYNE, MD 12/01/2013, 7:08 AM

## 2014-01-11 ENCOUNTER — Telehealth (INDEPENDENT_AMBULATORY_CARE_PROVIDER_SITE_OTHER): Payer: Self-pay

## 2014-01-11 ENCOUNTER — Encounter (INDEPENDENT_AMBULATORY_CARE_PROVIDER_SITE_OTHER): Payer: Self-pay | Admitting: Surgery

## 2014-01-11 ENCOUNTER — Ambulatory Visit (INDEPENDENT_AMBULATORY_CARE_PROVIDER_SITE_OTHER): Payer: BC Managed Care – PPO | Admitting: Surgery

## 2014-01-11 ENCOUNTER — Other Ambulatory Visit (INDEPENDENT_AMBULATORY_CARE_PROVIDER_SITE_OTHER): Payer: Self-pay | Admitting: Surgery

## 2014-01-11 VITALS — BP 112/68 | HR 84 | Temp 98.0°F | Resp 18 | Ht 63.0 in | Wt 176.0 lb

## 2014-01-11 DIAGNOSIS — N61 Mastitis without abscess: Secondary | ICD-10-CM

## 2014-01-11 DIAGNOSIS — N611 Abscess of the breast and nipple: Secondary | ICD-10-CM | POA: Insufficient documentation

## 2014-01-11 NOTE — Telephone Encounter (Signed)
Pathology sent to [facility] 01-11-14.

## 2014-01-11 NOTE — Progress Notes (Signed)
Patient ID: Lisa MarionDonna K Gerads, female   DOB: 1960/08/14, 53 y.o.   MRN: [id number]  Chief Complaint  Patient presents with  . New Evaluation    evaluate breast infection right    HPI Lisa Friedman is a 53 y.o. female.  Referred by Baltazar ApoNancy Hartman PA-C for right breast abscess HPI This is a 53 year old female who is 2 years status post her last mammogram who presents with a inflamed swollen right breast. She states that about 5 days ago, she started to notice some erythema of her right breast. However, at that time it was not tender. The erythema has spread across most of the central part of her breast. The area just above her right nipple has become quite tender and protrudes slightly. She reported a small amount of drainage yesterday. She presents now for urgent office evaluation.  Menarche age 53 First pregnancy age 53 Breast-feed none Family history negative  Past Medical History  Diagnosis Date  . Hypertension   . Asthma   . GERD (gastroesophageal reflux disease)   . Hypothyroidism     Past Surgical History  Procedure Laterality Date  . Tubal ligation    . Robotic assisted total hysterectomy Bilateral 11/30/2013    Procedure: ROBOTIC ASSISTED TOTAL HYSTERECTOMY with bilateral salpingectomy and epicentric uterosacral ligament fixation for sacrocolpopexy and cystoscopy;  Surgeon: Genia DelMarie-Lyne Lavoie, MD;  Location: [facility];  Service: Gynecology;  Laterality: Bilateral;    No family history on file.  Social History History  Substance Use Topics  . Smoking status: Former Games developermoker  . Smokeless tobacco: Not on file  . Alcohol Use: No    No Known Allergies  Current Outpatient Prescriptions  Medication Sig Dispense Refill  . albuterol (PROVENTIL HFA;VENTOLIN HFA) 108 (90 BASE) MCG/ACT inhaler Inhale 2 puffs into the lungs every 6 (six) hours as needed for wheezing or shortness of breath.      Marland Kitchen. aspirin 81 MG tablet Take 81 mg by mouth daily.      Marland Kitchen. esomeprazole (NEXIUM) 20 MG capsule  Take 20 mg by mouth daily at 12 noon.      Marland Kitchen. ibuprofen (ADVIL,MOTRIN) 200 MG tablet Take 400 mg by mouth every 6 (six) hours as needed for headache.      . levothyroxine (SYNTHROID, LEVOTHROID) 50 MCG tablet Take 50 mcg by mouth daily before breakfast.      . lisinopril-hydrochlorothiazide (PRINZIDE,ZESTORETIC) 20-12.5 MG per tablet Take 0.5 tablets by mouth daily.      Marland Kitchen. oxyCODONE-acetaminophen (PERCOCET) 7.5-325 MG per tablet Take 1 tablet by mouth every 4 (four) hours as needed for pain.  30 tablet  0  . Omeprazole (PRILOSEC PO) Take 1 tablet by mouth daily.       No current facility-administered medications for this visit.    Review of Systems Review of Systems  Constitutional: Negative for fever, chills and unexpected weight change.  HENT: Negative for congestion, hearing loss, sore throat, trouble swallowing and voice change.   Eyes: Negative for visual disturbance.  Respiratory: Negative for cough and wheezing.   Cardiovascular: Negative for chest pain, palpitations and leg swelling.  Gastrointestinal: Negative for nausea, vomiting, abdominal pain, diarrhea, constipation, blood in stool, abdominal distention and anal bleeding.  Genitourinary: Negative for hematuria, vaginal bleeding and difficulty urinating.  Musculoskeletal: Negative for arthralgias.  Skin: Positive for color change. Negative for rash and wound.  Neurological: Negative for seizures, syncope and headaches.  Hematological: Negative for adenopathy. Does not bruise/bleed easily.  Psychiatric/Behavioral: Negative for confusion.  Blood pressure 112/68, pulse 84, temperature 98 F (36.7 C), resp. rate 18, height 5\' 3"  (1.6 m), weight 176 lb (79.833 kg).  Physical Exam Physical Exam WDWN in NAD Right breast - widespread erythema involving at least 2/3 of the right breast; thick induration and slight fluctuance around and just above the right nipple; slightly darker skin discoloration in this area Data  Reviewed none  Assessment    Possible right breast abscess.  Another possibility would be inflammatory breast cancer, although this is less likely     Plan    We prepped the right breast with Betadine and anesthetized with 1% lidocaine. I made a round incision in the center of the area of greatest fluctuance. This excised tissue was sent for pathologic examination. We bluntly dissected in the small abscess cavity with a small amount of purulent fluid. This did not seem to track deeper into her breast. The wound was packed with quarter-inch Nu Gauze. A dry dressing was applied. She will remove the packing in 48 hours. We will call her with the pathology results. Recheck in about 2 weeks.       Loreda Silverio K. 01/11/2014, 4:42 PM

## 2014-01-15 ENCOUNTER — Telehealth (INDEPENDENT_AMBULATORY_CARE_PROVIDER_SITE_OTHER): Payer: Self-pay

## 2014-01-15 NOTE — Telephone Encounter (Signed)
Called pt to inform her pathology report just showed that the thickened skin was abscess. No sign of cancer. Pt verbalized understanding

## 2014-01-22 ENCOUNTER — Encounter (INDEPENDENT_AMBULATORY_CARE_PROVIDER_SITE_OTHER): Payer: Self-pay | Admitting: Surgery

## 2014-01-22 ENCOUNTER — Ambulatory Visit (INDEPENDENT_AMBULATORY_CARE_PROVIDER_SITE_OTHER): Payer: BC Managed Care – PPO | Admitting: Surgery

## 2014-01-22 VITALS — BP 112/78 | HR 73 | Temp 98.3°F | Ht 63.0 in | Wt 178.4 lb

## 2014-01-22 DIAGNOSIS — N61 Mastitis without abscess: Secondary | ICD-10-CM

## 2014-01-22 DIAGNOSIS — N611 Abscess of the breast and nipple: Secondary | ICD-10-CM

## 2014-01-22 NOTE — Progress Notes (Signed)
Status post incision and drainage of a right breast abscess on 01/11/14. The biopsy showed this had no signs of malignancy. Her wound is completely healed. No erythema. No drainage noted. No tenderness. She may resume colectomy. Followup when necessary.  Wilmon Arms. Corliss Skains, MD, [facility]  General/ Trauma Surgery  01/22/2014 12:04 PM

## 2014-01-29 ENCOUNTER — Encounter (INDEPENDENT_AMBULATORY_CARE_PROVIDER_SITE_OTHER): Payer: Self-pay

## 2014-07-11 ENCOUNTER — Other Ambulatory Visit: Payer: Self-pay | Admitting: Family Medicine

## 2014-07-11 DIAGNOSIS — Z1231 Encounter for screening mammogram for malignant neoplasm of breast: Secondary | ICD-10-CM

## 2014-07-16 ENCOUNTER — Ambulatory Visit: Payer: Self-pay

## 2014-07-18 ENCOUNTER — Ambulatory Visit
Admission: RE | Admit: 2014-07-18 | Discharge: 2014-07-18 | Disposition: A | Discharge status: Home / Self Care | Payer: BLUE CROSS/BLUE SHIELD | Source: Ambulatory Visit | Attending: Family Medicine | Admitting: Family Medicine

## 2014-07-18 DIAGNOSIS — Z1231 Encounter for screening mammogram for malignant neoplasm of breast: Secondary | ICD-10-CM

## 2016-10-29 ENCOUNTER — Other Ambulatory Visit: Payer: Self-pay | Admitting: Family Medicine

## 2016-10-29 DIAGNOSIS — Z1231 Encounter for screening mammogram for malignant neoplasm of breast: Secondary | ICD-10-CM

## 2016-11-17 ENCOUNTER — Ambulatory Visit
Admission: RE | Admit: 2016-11-17 | Discharge: 2016-11-17 | Disposition: A | Discharge status: Home / Self Care | Payer: BLUE CROSS/BLUE SHIELD | Source: Ambulatory Visit | Attending: Family Medicine | Admitting: Family Medicine

## 2016-11-17 DIAGNOSIS — Z1231 Encounter for screening mammogram for malignant neoplasm of breast: Secondary | ICD-10-CM

## 2019-06-21 ENCOUNTER — Other Ambulatory Visit: Payer: Self-pay | Admitting: Family Medicine

## 2019-06-21 DIAGNOSIS — Z1231 Encounter for screening mammogram for malignant neoplasm of breast: Secondary | ICD-10-CM

## 2019-07-04 ENCOUNTER — Other Ambulatory Visit: Payer: Self-pay

## 2019-07-04 ENCOUNTER — Ambulatory Visit
Admission: RE | Admit: 2019-07-04 | Discharge: 2019-07-04 | Disposition: A | Discharge status: Home / Self Care | Payer: Commercial Managed Care - PPO | Source: Ambulatory Visit | Attending: Family Medicine | Admitting: Family Medicine

## 2019-07-04 DIAGNOSIS — Z1231 Encounter for screening mammogram for malignant neoplasm of breast: Secondary | ICD-10-CM

## 2019-07-04 IMAGING — MG DIGITAL SCREENING BILAT W/ TOMO W/ CAD
8 series · 8 of 24 positions shown · non-contrast
Comparison: Previous exam(s).

CLINICAL DATA: Screening.

EXAM:
DIGITAL SCREENING BILATERAL MAMMOGRAM WITH TOMO AND CAD

[R MLO synth-2D]
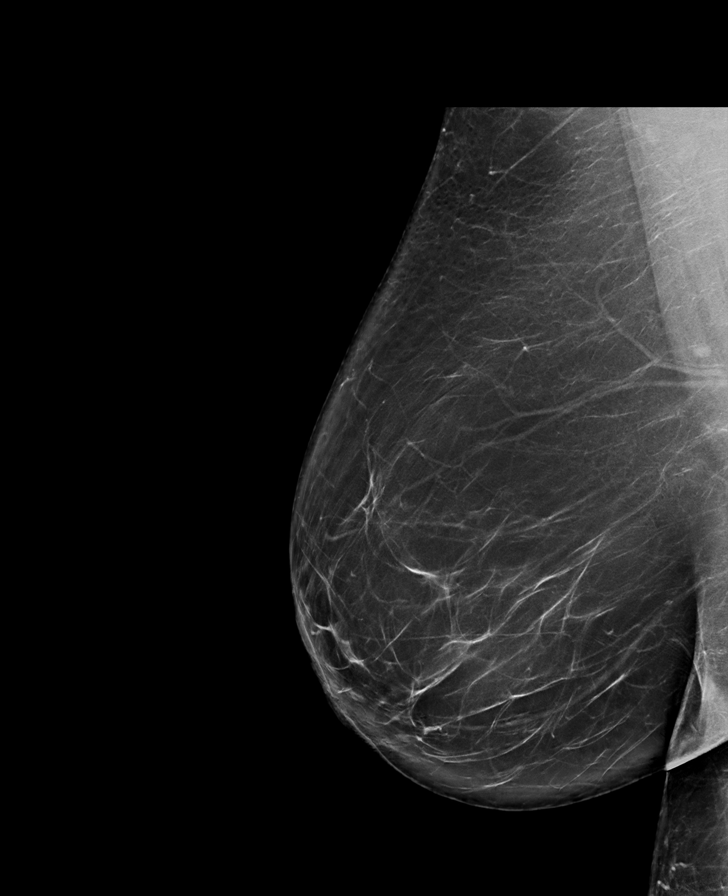

[L MLO synth-2D]
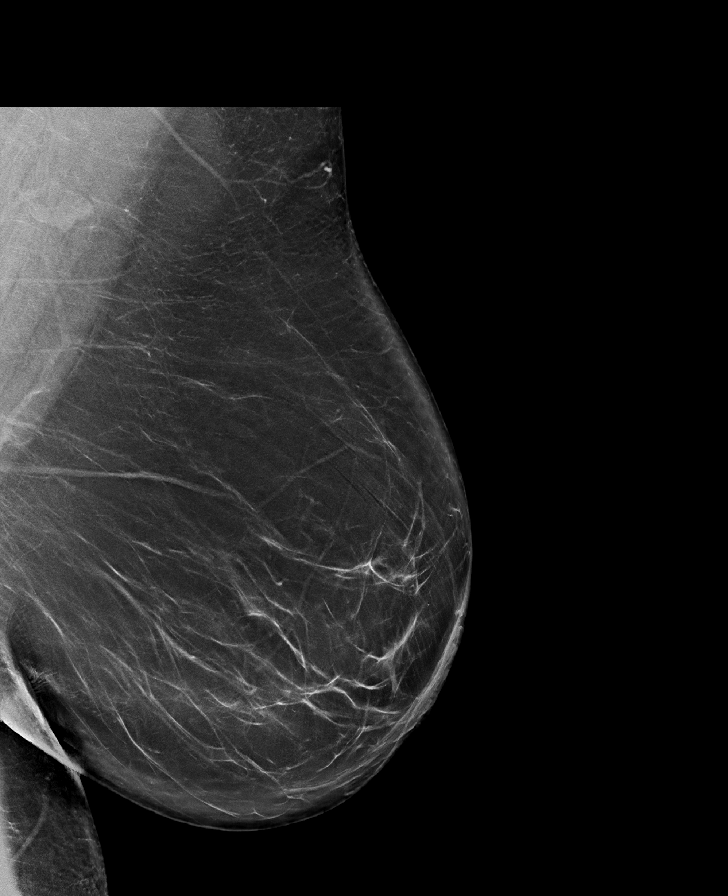

[R CC synth-2D]
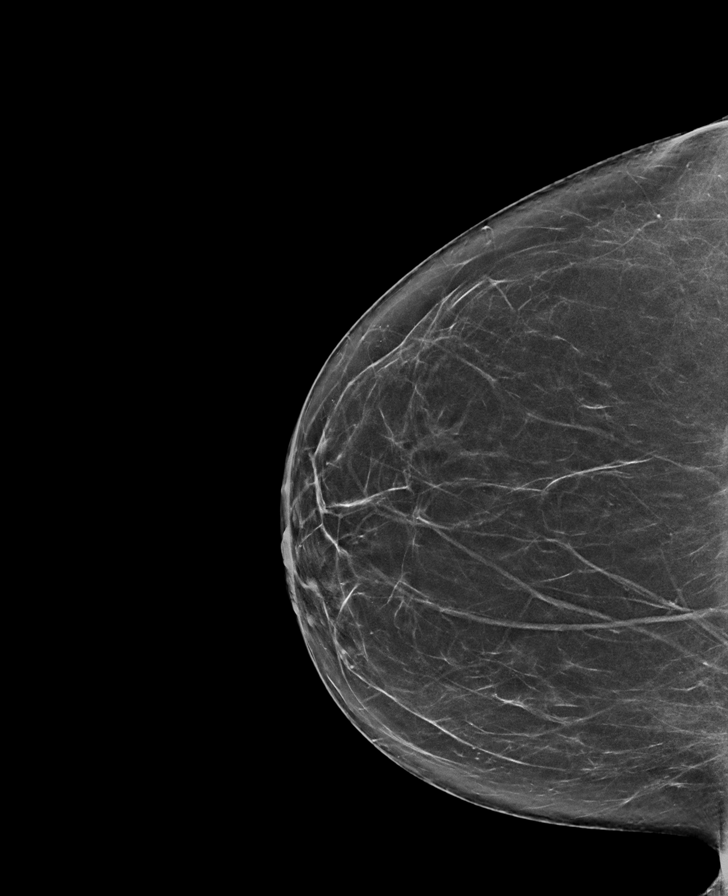

[L CC synth-2D]
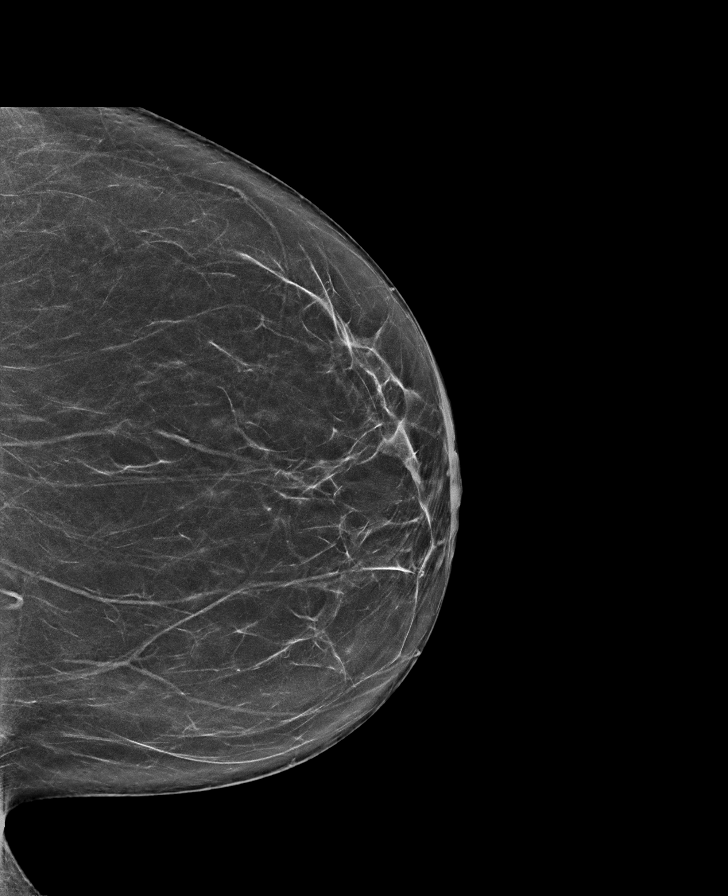

[L CC tomo · tomo slice 35/70.0]
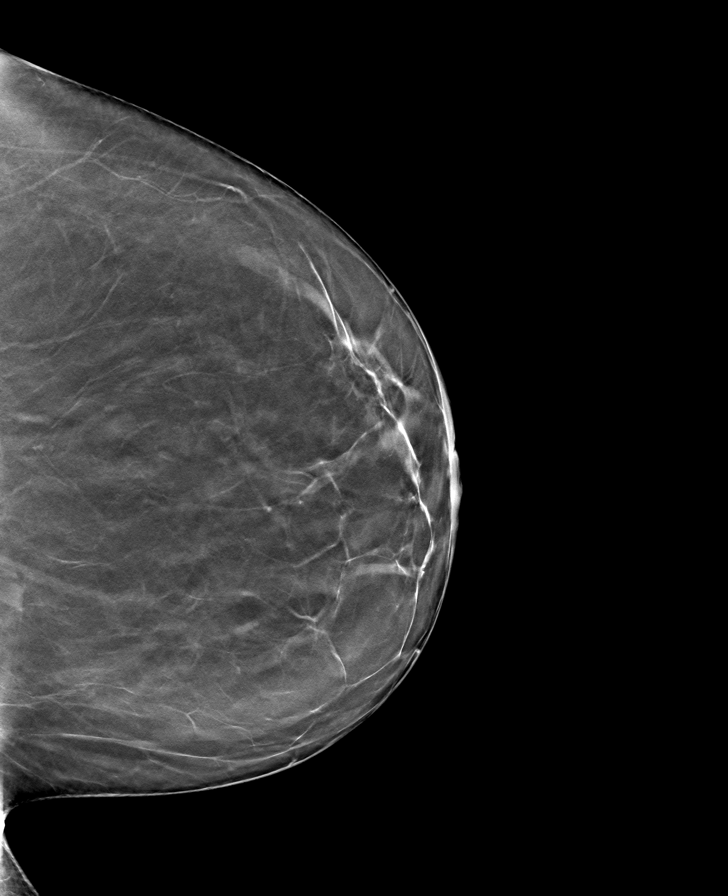

[R CC tomo · tomo slice 35/69.0]
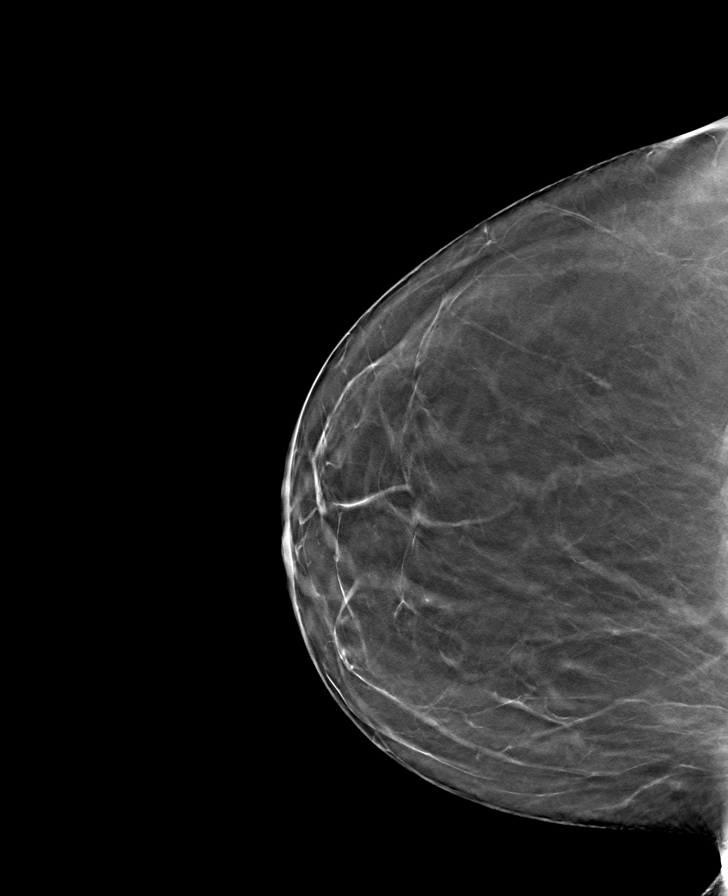

[R MLO tomo · tomo slice 47/93.0]
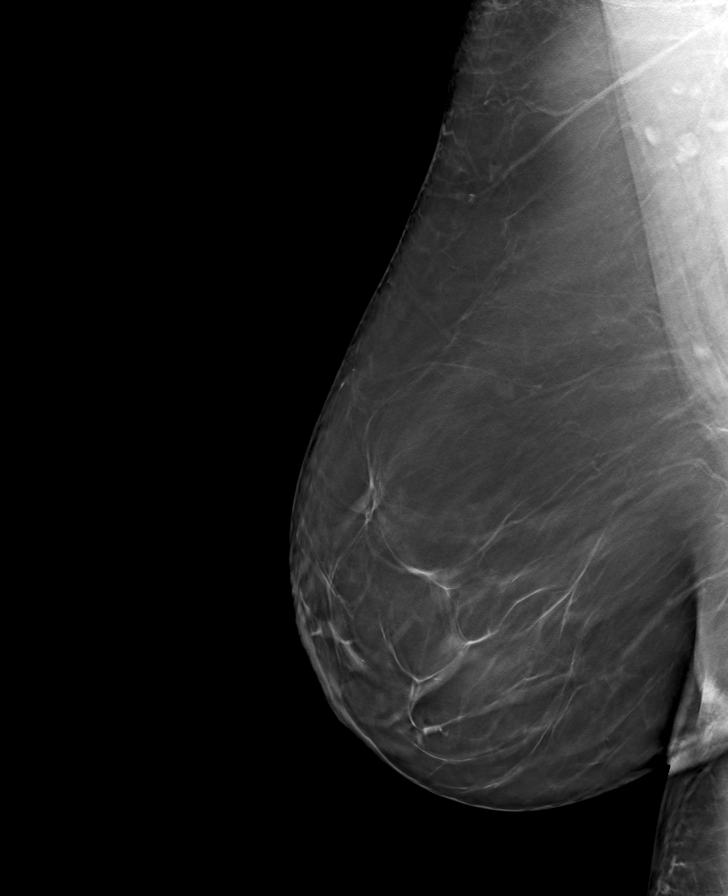

[L MLO tomo · tomo slice 47/93.0]
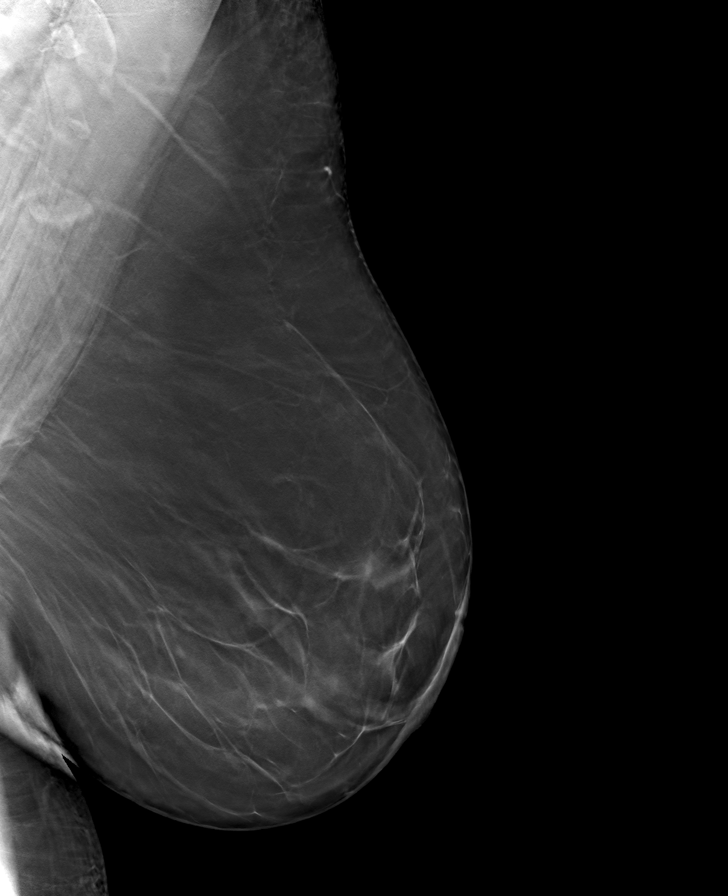

[8 of 24 positions shown; findings below may reference images not displayed]

ACR Breast Density Category b: There are scattered areas of
fibroglandular density.
FINDINGS: There are no findings suspicious for malignancy. Images were
processed with CAD.
IMPRESSION: No mammographic evidence of malignancy. A result letter of this
screening mammogram will be mailed directly to the patient.

RECOMMENDATION:
Screening mammogram in one year. (Code:[TQ])

BI-RADS CATEGORY  1: Negative.

## 2022-05-12 ENCOUNTER — Other Ambulatory Visit: Payer: Self-pay | Admitting: Family Medicine

## 2022-05-12 DIAGNOSIS — Z1231 Encounter for screening mammogram for malignant neoplasm of breast: Secondary | ICD-10-CM

## 2022-06-11 ENCOUNTER — Ambulatory Visit
Admission: RE | Admit: 2022-06-11 | Discharge: 2022-06-11 | Disposition: A | Discharge status: Home / Self Care | Payer: BLUE CROSS/BLUE SHIELD | Source: Ambulatory Visit | Attending: Family Medicine | Admitting: Family Medicine

## 2022-06-11 DIAGNOSIS — Z1231 Encounter for screening mammogram for malignant neoplasm of breast: Secondary | ICD-10-CM
